# Patient Record
Sex: Male | Born: 1950 | Race: Black or African American | Hispanic: No | Marital: Married
Health system: Southern US, Community
[De-identification: ages and names within clinical notes are randomized; demographics above are authoritative.]

---

## 2014-05-13 ENCOUNTER — Inpatient Hospital Stay
Admission: AD | Admit: 2014-05-13 | Discharge: 2014-06-04 | Disposition: A | Discharge status: Skilled Nursing Facility | Payer: Self-pay | Source: Ambulatory Visit | Attending: Internal Medicine | Admitting: Internal Medicine

## 2014-05-13 DIAGNOSIS — R0602 Shortness of breath: Secondary | ICD-10-CM

## 2014-05-13 DIAGNOSIS — J942 Hemothorax: Secondary | ICD-10-CM

## 2014-05-13 DIAGNOSIS — IMO0002 Reserved for concepts with insufficient information to code with codable children: Secondary | ICD-10-CM

## 2014-05-13 DIAGNOSIS — K567 Ileus, unspecified: Secondary | ICD-10-CM

## 2014-05-13 DIAGNOSIS — K219 Gastro-esophageal reflux disease without esophagitis: Secondary | ICD-10-CM

## 2014-05-13 DIAGNOSIS — E119 Type 2 diabetes mellitus without complications: Secondary | ICD-10-CM

## 2014-05-13 DIAGNOSIS — J940 Chylous effusion: Secondary | ICD-10-CM

## 2014-05-13 DIAGNOSIS — Z902 Acquired absence of lung [part of]: Secondary | ICD-10-CM

## 2014-05-13 DIAGNOSIS — J939 Pneumothorax, unspecified: Secondary | ICD-10-CM

## 2014-05-13 DIAGNOSIS — J9620 Acute and chronic respiratory failure, unspecified whether with hypoxia or hypercapnia: Secondary | ICD-10-CM

## 2014-05-13 DIAGNOSIS — I10 Essential (primary) hypertension: Secondary | ICD-10-CM

## 2014-05-13 DIAGNOSIS — R05 Cough: Secondary | ICD-10-CM

## 2014-05-13 DIAGNOSIS — K9189 Other postprocedural complications and disorders of digestive system: Secondary | ICD-10-CM

## 2014-05-13 DIAGNOSIS — K7400 Hepatic fibrosis, unspecified: Secondary | ICD-10-CM

## 2014-05-13 DIAGNOSIS — J9382 Other air leak: Secondary | ICD-10-CM

## 2014-05-13 DIAGNOSIS — Z9889 Other specified postprocedural states: Secondary | ICD-10-CM

## 2014-05-13 DIAGNOSIS — J439 Emphysema, unspecified: Secondary | ICD-10-CM

## 2014-05-13 DIAGNOSIS — J9690 Respiratory failure, unspecified, unspecified whether with hypoxia or hypercapnia: Secondary | ICD-10-CM

## 2014-05-13 DIAGNOSIS — R627 Adult failure to thrive: Secondary | ICD-10-CM

## 2014-05-13 DIAGNOSIS — R059 Cough, unspecified: Secondary | ICD-10-CM

## 2014-05-13 DIAGNOSIS — J9383 Other pneumothorax: Secondary | ICD-10-CM

## 2014-05-14 ENCOUNTER — Institutional Professional Consult (permissible substitution) (HOSPITAL_COMMUNITY): Payer: Self-pay

## 2014-05-14 DIAGNOSIS — J9621 Acute and chronic respiratory failure with hypoxia: Secondary | ICD-10-CM

## 2014-05-14 DIAGNOSIS — K219 Gastro-esophageal reflux disease without esophagitis: Secondary | ICD-10-CM

## 2014-05-14 DIAGNOSIS — J9382 Other air leak: Secondary | ICD-10-CM

## 2014-05-14 DIAGNOSIS — J9620 Acute and chronic respiratory failure, unspecified whether with hypoxia or hypercapnia: Secondary | ICD-10-CM

## 2014-05-14 DIAGNOSIS — Z9889 Other specified postprocedural states: Secondary | ICD-10-CM

## 2014-05-14 DIAGNOSIS — J439 Emphysema, unspecified: Secondary | ICD-10-CM

## 2014-05-14 DIAGNOSIS — E119 Type 2 diabetes mellitus without complications: Secondary | ICD-10-CM

## 2014-05-14 DIAGNOSIS — R627 Adult failure to thrive: Secondary | ICD-10-CM

## 2014-05-14 DIAGNOSIS — Z902 Acquired absence of lung [part of]: Secondary | ICD-10-CM

## 2014-05-14 DIAGNOSIS — I10 Essential (primary) hypertension: Secondary | ICD-10-CM

## 2014-05-14 DIAGNOSIS — J9383 Other pneumothorax: Secondary | ICD-10-CM

## 2014-05-14 DIAGNOSIS — IMO0002 Reserved for concepts with insufficient information to code with codable children: Secondary | ICD-10-CM

## 2014-05-14 DIAGNOSIS — K9189 Other postprocedural complications and disorders of digestive system: Secondary | ICD-10-CM

## 2014-05-14 DIAGNOSIS — K567 Ileus, unspecified: Secondary | ICD-10-CM

## 2014-05-14 LAB — COMPREHENSIVE METABOLIC PANEL
ALT: 256 U/L — ABNORMAL HIGH (ref 0–53)
ANION GAP: 13 (ref 5–15)
AST: 95 U/L — ABNORMAL HIGH (ref 0–37)
Albumin: 2.6 g/dL — ABNORMAL LOW (ref 3.5–5.2)
Alkaline Phosphatase: 166 U/L — ABNORMAL HIGH (ref 39–117)
BILIRUBIN TOTAL: 2.7 mg/dL — AB (ref 0.3–1.2)
BUN: 25 mg/dL — AB (ref 6–23)
CO2: 26 mmol/L (ref 19–32)
CREATININE: 0.58 mg/dL (ref 0.50–1.35)
Calcium: 8.6 mg/dL (ref 8.4–10.5)
Chloride: 94 mmol/L — ABNORMAL LOW (ref 96–112)
GFR calc Af Amer: >90 mL/min (ref >90)
Glucose, Bld: 104 mg/dL — ABNORMAL HIGH (ref 70–99)
Potassium: 4.1 mmol/L (ref 3.5–5.1)
Sodium: 133 mmol/L — ABNORMAL LOW (ref 135–145)
Total Protein: 5.5 g/dL — ABNORMAL LOW (ref 6.0–8.3)

## 2014-05-14 LAB — SEDIMENTATION RATE: Sed Rate: 3 mm/hr (ref 0–16)

## 2014-05-14 LAB — CBC
HCT: 38.5 % — ABNORMAL LOW (ref 39.0–52.0)
HEMOGLOBIN: 13.2 g/dL (ref 13.0–17.0)
MCH: 27.8 pg (ref 26.0–34.0)
MCHC: 34.3 g/dL (ref 30.0–36.0)
MCV: 81.2 fL (ref 78.0–100.0)
Platelets: 261 10*3/uL (ref 150–400)
RBC: 4.74 MIL/uL (ref 4.22–5.81)
RDW: 21.6 % — AB (ref 11.5–15.5)
WBC: 22.1 10*3/uL — AB (ref 4.0–10.5)

## 2014-05-14 LAB — PHOSPHORUS: Phosphorus: 4.2 mg/dL (ref 2.3–4.6)

## 2014-05-14 NOTE — Consult Note (Signed)
Name: Sergio Cox MRN: [id number] DOB: 11-09-1950    ADMISSION DATE:  05/13/2014 CONSULTATION DATE:  4/15  REFERRING MD :  [facility]  CHIEF COMPLAINT:  sob  BRIEF PATIENT DESCRIPTION: Frail wasted AAM  SIGNIFICANT EVENTS  4/14 tx to ssh Right PICC>>  STUDIES:    HISTORY OF PRESENT ILLNESS:  Sergio Cox is a former smoker with severe emphysema/COPD, former smoker who has a long history of spontaneous pneumothoraces and presented with failed heimlich valve and presented to [facility] for further treatment. He has had recent extensive thoracic surgery by Dr. Donzetta Kohut [phone]) at [facility] that included A. 03/16/14 Rt thoracotomy with bleb resection. B. RLL lobectomy 03/30/14. C. FOB with bronchial valve placement. Despite these aggressive interventions he continues to have a right apical pnx, progressive FTT, poor pulmonary function. He was transferred to [facility] 4/14 with known rt pnx post chest tube removal and PCCM is consulted 4/15. There is a note from Dr.  Donzetta Kohut [facility] not to place further chest tubes unless he is called.  PAST MEDICAL HISTORY :     Acute on chronic respiratory failure   COPD with emphysema   Spontaneous pneumothorax   S/P lobectomy of lung 03/30/14 with rll lobectomy   Persistent air leak   Ileus, postoperative   FTT (failure to thrive) in adult   HTN (hypertension)   DM2 (diabetes mellitus, type 2)   GERD (gastroesophageal reflux disease)   S/P thoracotomy 2/16 /16   S/P bronchoscopy 3/25 rml valves placed   Prior to Admission medications   Not on File   Allergies  Allergen Reactions  . Opium   . Penicillins     FAMILY HISTORY:  family history is not on file. SOCIAL HISTORY:  Former smoker  REVIEW OF SYSTEMS:   10 point review of system taken, please see HPI for positives and negatives.  SUBJECTIVE:   VITAL SIGNS: Vital signs reviewed. Abnormal values will appear under impression plan section.    PHYSICAL EXAMINATION: General:  Frail, thin, increased wob, poor cough mechanics, AAF Neuro:  Intact, MAE x 4, speech is clear HEENT:  No JVD, NO LAN, Oral mucosa dry Cardiovascular: HSR RRR No murmur Lungs:  Decreased air movement, + rhonchi, congested., mild exp wheeze Abdomen:  +bs, soft Musculoskeletal:  intact Skin:  Warm and dry   Recent Labs Lab 05/14/14 0540  NA 133*  K 4.1  CL 94*  CO2 26  BUN 25*  CREATININE 0.58  GLUCOSE 104*    Recent Labs Lab 05/14/14 0540  HGB 13.2  HCT 38.5*  WBC 22.1*  PLT 261   Dg Chest Port 1 View  05/14/2014   CLINICAL DATA:  Respiratory failure. Known right pneumothorax with history of multiple prior pneumothoraces.  EXAM: PORTABLE CHEST - 1 VIEW  COMPARISON:  None.  FINDINGS: A right PICC line is present with tip projecting over the lower SVC. The cardiac silhouette is within normal limits for size. Postsurgical changes are present in the right lung with multiple staple lines as well as a a filter type device projecting over the right hilum. There is a small right apical pneumothorax. There is abnormal confluent parenchymal opacity in the medial right upper lobe, with patchy opacities in the right perihilar and right basilar regions. Bullous changes are present in the left lung. Scattered linear densities in the left lung base may represent scarring. No sizable pleural effusion is identified.  IMPRESSION: 1. Small right pneumothorax. 2. Postsurgical changes in the  right lung with abnormal opacity in the medial right upper lobe as well as patchy opacities in the perihilar and basilar regions. Findings may reflect a combination of postsurgical changes, chronic interstitial lung disease, and atelectasis, although superimposed infection or a right upper lobe mass are not excluded. Comparison with prior studies is suggested to assess the chronicity of these findings as well as radiographic follow-up during/following the patient's acute treatment.  These results were called by telephone at the time of interpretation on 05/14/2014 at 8:27 am to Malena Peerlivia Strader, RN, who verbally acknowledged these results.   Electronically Signed   By: Sebastian AcheAllen  Grady   On: 05/14/2014 08:33    ASSESSMENT     Acute on chronic respiratory failure   Chronic rt pnx   COPD with emphysema   Spontaneous pneumothorax    Thoracotomy 03/16/14 rt bleb resection.   S/P lobectomy of lung 03/30/14 with rll lobectomy   S/P bronchoscopy 3/25 rml valves placed   Persistent air leak   Ileus, postoperative   FTT (failure to thrive) in adult   HTN (hypertension)   DM2 (diabetes mellitus, type 2)   GERD (gastroesophageal reflux disease)      Discussion: Mr. Sergio Cox is a former smoker with severe emphysema/COPD, former smoker who has a long history of spontaneous pneumothoraces and presented with failed heimlich valve and [facility]. He has had recent extensive thoracic surgery by Dr. Delia ChimesEdgerton([phone]) [facility] that included A. 03/16/14 Rt thoracotomy with bleb resection. B. RLL lobectomy 03/30/14. C. FOB with bronchial valve placement. Despite these aggressive interventions he continues to have a right apical pnx, progressive FTT, poor pulmonary function. [facility] with known rt pnx post chest tube removal and PCCM is consulted 4/15. There is a note from Dr.  Donzetta KohutEdgerton [facility] not to place further chest tubes unless he is called. At this point unclear whether there is an intervention to be made to assist with the presumed intermittent recurrent air leaks  PLAN:  O2 as needed Serial chest x rays BD's as needed Could consult [facility] for a second opinion regarding any possible intervention here, but he has already been extensively evaluated and is well known to Dr Donzetta KohutEdgerton; at this point would not place ct unless he had tension PTX Doubt he could undergo FOB in his weakened state; unclear  whether one-way valve would be useful.  Chronic steroids ? If tested for alpha 1 AT deficiency    [facility] ACNP [facility] P[facility] Pager [phone] till 3 pm If no answer page [phone] 05/14/2014, 9:41 AM   Attending Note:  I have examined patient, reviewed labs, studies and notes. I have discussed the case with S Minor, and I agree with the data and plans as amended above.   Levy Pupaobert Byrum, MD, PhD 05/14/2014, 1:14 PM [facility] [phone] or if no answer [phone]

## 2014-05-15 LAB — DIGOXIN LEVEL: DIGOXIN LVL: 1.1 ng/mL (ref 0.8–2.0)

## 2014-05-17 LAB — CULTURE, RESPIRATORY W GRAM STAIN

## 2014-05-17 LAB — CULTURE, RESPIRATORY

## 2014-05-17 NOTE — Consult Note (Signed)
Name: Sergio Cox MRN: [id number] DOB: [birth year]    ADMISSION DATE:  05/13/2014 CONSULTATION DATE:  4/15  [facility]  CHIEF COMPLAINT:  sob  BRIEF PATIENT DESCRIPTION: Mr. Sergio Cox is a former smoker with severe emphysema/COPD, former smoker who has a long history of spontaneous pneumothoraces and presented with failed heimlich valve and [facility]. He has had recent extensive thoracic surgery by Dr. Donzetta KohutEdgerton [phone]) [facility] that included A. 03/16/14 Rt thoracotomy with bleb resection. B. RLL lobectomy 03/30/14. C. FOB with bronchial valve placement. Despite these aggressive interventions he continues to have a right apical pnx, progressive FTT, poor pulmonary function. [facility] with known rt pnx post chest tube removal and PCCM is consulted 4/15. There is a note from Dr.  Donzetta KohutEdgerton [facility] not to place further chest tubes unless he is called.  SIGNIFICANT EVENTS  4/14 tx to ssh Right PICC>>  STUDIES:    SUBJECTIVE:  Feeling better.  Still with weak cough. No new c/o.  On 3L [place].   VITAL SIGNS: Vital signs reviewed. Abnormal values will appear under impression plan section.   PHYSICAL EXAMINATION: General:  Frail, thin, NAD  Neuro:  Intact, MAE x 4, speech is clear HEENT:  No JVD, NO LAN, Oral mucosa dry Cardiovascular: HSR RRR No murmur Lungs:  Decreased air movement, + wet rhonchi, congested, no audible wheeze, wet cough Abdomen:  +bs, soft Musculoskeletal:  intact Skin:  Warm and dry   Recent Labs Lab 05/14/14 0540  NA 133*  K 4.1  CL 94*  CO2 26  BUN 25*  CREATININE 0.58  GLUCOSE 104*    Recent Labs Lab 05/14/14 0540  HGB 13.2  HCT 38.5*  WBC 22.1*  PLT 261   No results found.  ASSESSMENT     Acute on chronic respiratory failure   Chronic rt pnx   COPD with emphysema   Spontaneous pneumothorax    Thoracotomy 03/16/14 rt bleb resection.  S/P lobectomy of lung 03/30/14 with rll lobectomy   S/P bronchoscopy 3/25 rml valves placed   Persistent air leak   Ileus, postoperative   FTT (failure to thrive) in adult   HTN (hypertension)   DM2 (diabetes mellitus, type 2)   GERD (gastroesophageal reflux disease)  PLAN: - Supplemental O2 as needed - F/u CXR 4/19 - BD's as needed - Could consult [facility]CTS for a second opinion regarding any possible intervention here, but he has already been extensively evaluated and is well known to Dr Donzetta KohutEdgerton; at this point would not place CT unless he had tension PTX - Chronic steroids - more aggressive pulmonary hygiene - incentive, flutter, mobilize  PCCM signing off, please call back if needed.   Dirk DressKaty Whiteheart, NP 05/17/2014  11:42 AM Pager: [phone] or [phone]  I reviewed CXR myself, small right PTX and opacification of medial RUL.  Spontaneous PTX: will order repeat CXR to insure resolution, no need for CT placement at this time.  COPD with emphysema and new hypoxemia: not wheezing, continue chronic steroids, supplemental O2 for sat of 88-92%.  Persistent air leak: place tube to suction and avoid positive pressure ventilation until resolution, no further chest tube placement.  Plan as above, PCCM will sign off, please call back if needed.  Patient seen and examined, agree with above note.  I dictated the care and orders written for this patient under my direction.  Alyson ReedyWesam G Catherene Kaleta, MD  [phone]  

## 2014-05-19 ENCOUNTER — Other Ambulatory Visit (HOSPITAL_COMMUNITY): Payer: Self-pay

## 2014-05-19 LAB — BASIC METABOLIC PANEL
Anion gap: 12 (ref 5–15)
BUN: 24 mg/dL — ABNORMAL HIGH (ref 6–23)
CHLORIDE: 93 mmol/L — AB (ref 96–112)
CO2: 29 mmol/L (ref 19–32)
Calcium: 8.7 mg/dL (ref 8.4–10.5)
Creatinine, Ser: 0.61 mg/dL (ref 0.50–1.35)
GFR calc Af Amer: >90 mL/min (ref >90)
GFR calc non Af Amer: >90 mL/min (ref >90)
Glucose, Bld: 105 mg/dL — ABNORMAL HIGH (ref 70–99)
POTASSIUM: 3.9 mmol/L (ref 3.5–5.1)
SODIUM: 134 mmol/L — AB (ref 135–145)

## 2014-05-19 LAB — CBC WITH DIFFERENTIAL/PLATELET
Basophils Absolute: 0 10*3/uL (ref 0.0–0.1)
Basophils Relative: 0 % (ref 0–1)
EOS PCT: 0 % (ref 0–5)
Eosinophils Absolute: 0 10*3/uL (ref 0.0–0.7)
HCT: 36.4 % — ABNORMAL LOW (ref 39.0–52.0)
Hemoglobin: 12.2 g/dL — ABNORMAL LOW (ref 13.0–17.0)
LYMPHS ABS: 3.2 10*3/uL (ref 0.7–4.0)
Lymphocytes Relative: 13 % (ref 12–46)
MCH: 28.4 pg (ref 26.0–34.0)
MCHC: 33.5 g/dL (ref 30.0–36.0)
MCV: 84.7 fL (ref 78.0–100.0)
Monocytes Absolute: 1.5 10*3/uL — ABNORMAL HIGH (ref 0.1–1.0)
Monocytes Relative: 6 % (ref 3–12)
Neutro Abs: 19.6 10*3/uL — ABNORMAL HIGH (ref 1.7–7.7)
Neutrophils Relative %: 81 % — ABNORMAL HIGH (ref 43–77)
Platelets: 241 10*3/uL (ref 150–400)
RBC: 4.3 MIL/uL (ref 4.22–5.81)
RDW: 19.5 % — ABNORMAL HIGH (ref 11.5–15.5)
WBC: 24.3 10*3/uL — ABNORMAL HIGH (ref 4.0–10.5)

## 2014-05-20 LAB — CBC WITH DIFFERENTIAL/PLATELET
BASOS PCT: 1 % (ref 0–1)
Basophils Absolute: 0 10*3/uL (ref 0.0–0.1)
Basophils Absolute: 0.2 10*3/uL — ABNORMAL HIGH (ref 0.0–0.1)
Basophils Relative: 0 % (ref 0–1)
EOS PCT: 0 % (ref 0–5)
EOS PCT: 0 % (ref 0–5)
Eosinophils Absolute: 0 10*3/uL (ref 0.0–0.7)
Eosinophils Absolute: 0 10*3/uL (ref 0.0–0.7)
HCT: 38.7 % — ABNORMAL LOW (ref 39.0–52.0)
HCT: 37.6 % — ABNORMAL LOW (ref 39.0–52.0)
HEMOGLOBIN: 13.1 g/dL (ref 13.0–17.0)
Hemoglobin: 13.3 g/dL (ref 13.0–17.0)
LYMPHS ABS: 3.1 10*3/uL (ref 0.7–4.0)
LYMPHS PCT: 17 % (ref 12–46)
LYMPHS PCT: 13 % (ref 12–46)
Lymphs Abs: 4.8 10*3/uL — ABNORMAL HIGH (ref 0.7–4.0)
MCH: 28.6 pg (ref 26.0–34.0)
MCH: 29.3 pg (ref 26.0–34.0)
MCHC: 34.4 g/dL (ref 30.0–36.0)
MCHC: 34.8 g/dL (ref 30.0–36.0)
MCV: 83.2 fL (ref 78.0–100.0)
MCV: 84.1 fL (ref 78.0–100.0)
MONO ABS: 1.4 10*3/uL — AB (ref 0.1–1.0)
Monocytes Absolute: 0.7 10*3/uL (ref 0.1–1.0)
Monocytes Relative: 5 % (ref 3–12)
Monocytes Relative: 3 % (ref 3–12)
NEUTROS PCT: 78 % — AB (ref 43–77)
Neutro Abs: 21.9 10*3/uL — ABNORMAL HIGH (ref 1.7–7.7)
Neutro Abs: 19.8 10*3/uL — ABNORMAL HIGH (ref 1.7–7.7)
Neutrophils Relative %: 83 % — ABNORMAL HIGH (ref 43–77)
PLATELETS: 247 10*3/uL (ref 150–400)
Platelets: 252 10*3/uL (ref 150–400)
RBC: 4.65 MIL/uL (ref 4.22–5.81)
RBC: 4.47 MIL/uL (ref 4.22–5.81)
RDW: 19.2 % — ABNORMAL HIGH (ref 11.5–15.5)
RDW: 19.2 % — AB (ref 11.5–15.5)
WBC: 28.1 10*3/uL — ABNORMAL HIGH (ref 4.0–10.5)
WBC: 23.8 10*3/uL — AB (ref 4.0–10.5)

## 2014-05-20 LAB — PROCALCITONIN: Procalcitonin: 0.31 ng/mL

## 2014-05-21 LAB — PHOSPHORUS: Phosphorus: 4 mg/dL (ref 2.3–4.6)

## 2014-05-21 LAB — BASIC METABOLIC PANEL
Anion gap: 13 (ref 5–15)
BUN: 25 mg/dL — ABNORMAL HIGH (ref 6–23)
CALCIUM: 8.7 mg/dL (ref 8.4–10.5)
CHLORIDE: 92 mmol/L — AB (ref 96–112)
CO2: 28 mmol/L (ref 19–32)
Creatinine, Ser: 0.56 mg/dL (ref 0.50–1.35)
GFR calc non Af Amer: >90 mL/min (ref >90)
Glucose, Bld: 126 mg/dL — ABNORMAL HIGH (ref 70–99)
Potassium: 3.5 mmol/L (ref 3.5–5.1)
SODIUM: 133 mmol/L — AB (ref 135–145)

## 2014-05-21 LAB — MAGNESIUM: Magnesium: 2 mg/dL (ref 1.5–2.5)

## 2014-05-21 LAB — URINALYSIS, ROUTINE W REFLEX MICROSCOPIC
GLUCOSE, UA: 250 mg/dL — AB
Hgb urine dipstick: NEGATIVE
KETONES UR: NEGATIVE mg/dL
Leukocytes, UA: NEGATIVE
Nitrite: NEGATIVE
Protein, ur: 30 mg/dL — AB
Specific Gravity, Urine: 1.023 (ref 1.005–1.030)
Urobilinogen, UA: 1 mg/dL (ref 0.0–1.0)
pH: 5.5 (ref 5.0–8.0)

## 2014-05-21 LAB — URINE MICROSCOPIC-ADD ON

## 2014-05-21 LAB — CLOSTRIDIUM DIFFICILE BY PCR: Toxigenic C. Difficile by PCR: NEGATIVE

## 2014-05-22 LAB — CBC WITH DIFFERENTIAL/PLATELET
Basophils Absolute: 0 10*3/uL (ref 0.0–0.1)
Basophils Relative: 0 % (ref 0–1)
EOS PCT: 0 % (ref 0–5)
Eosinophils Absolute: 0 10*3/uL (ref 0.0–0.7)
HCT: 37.6 % — ABNORMAL LOW (ref 39.0–52.0)
HEMOGLOBIN: 12.7 g/dL — AB (ref 13.0–17.0)
LYMPHS PCT: 13 % (ref 12–46)
Lymphs Abs: 3.3 10*3/uL (ref 0.7–4.0)
MCH: 28.3 pg (ref 26.0–34.0)
MCHC: 33.8 g/dL (ref 30.0–36.0)
MCV: 83.9 fL (ref 78.0–100.0)
MONOS PCT: 5 % (ref 3–12)
Monocytes Absolute: 1.3 10*3/uL — ABNORMAL HIGH (ref 0.1–1.0)
NEUTROS ABS: 20.6 10*3/uL — AB (ref 1.7–7.7)
Neutrophils Relative %: 82 % — ABNORMAL HIGH (ref 43–77)
Platelets: 220 10*3/uL (ref 150–400)
RBC: 4.48 MIL/uL (ref 4.22–5.81)
RDW: 19.2 % — ABNORMAL HIGH (ref 11.5–15.5)
WBC: 25.2 10*3/uL — ABNORMAL HIGH (ref 4.0–10.5)

## 2014-05-22 LAB — BASIC METABOLIC PANEL
Anion gap: 10 (ref 5–15)
BUN: 22 mg/dL (ref 6–23)
CHLORIDE: 95 mmol/L — AB (ref 96–112)
CO2: 29 mmol/L (ref 19–32)
CREATININE: 0.57 mg/dL (ref 0.50–1.35)
Calcium: 8.8 mg/dL (ref 8.4–10.5)
GFR calc non Af Amer: >90 mL/min (ref >90)
Glucose, Bld: 134 mg/dL — ABNORMAL HIGH (ref 70–99)
POTASSIUM: 4.1 mmol/L (ref 3.5–5.1)
SODIUM: 134 mmol/L — AB (ref 135–145)

## 2014-05-24 ENCOUNTER — Other Ambulatory Visit (HOSPITAL_COMMUNITY): Payer: Self-pay

## 2014-05-24 LAB — CBC WITH DIFFERENTIAL/PLATELET
Basophils Absolute: 0 10*3/uL (ref 0.0–0.1)
Basophils Relative: 0 % (ref 0–1)
EOS ABS: 0 10*3/uL (ref 0.0–0.7)
Eosinophils Relative: 0 % (ref 0–5)
HCT: 40.6 % (ref 39.0–52.0)
Hemoglobin: 13.7 g/dL (ref 13.0–17.0)
Lymphocytes Relative: 14 % (ref 12–46)
Lymphs Abs: 3.8 10*3/uL (ref 0.7–4.0)
MCH: 28.5 pg (ref 26.0–34.0)
MCHC: 33.7 g/dL (ref 30.0–36.0)
MCV: 84.4 fL (ref 78.0–100.0)
MONO ABS: 1.3 10*3/uL — AB (ref 0.1–1.0)
Monocytes Relative: 5 % (ref 3–12)
Neutro Abs: 21.8 10*3/uL — ABNORMAL HIGH (ref 1.7–7.7)
Neutrophils Relative %: 81 % — ABNORMAL HIGH (ref 43–77)
Platelets: 223 10*3/uL (ref 150–400)
RBC: 4.81 MIL/uL (ref 4.22–5.81)
RDW: 19.4 % — ABNORMAL HIGH (ref 11.5–15.5)
WBC: 26.9 10*3/uL — AB (ref 4.0–10.5)

## 2014-05-24 LAB — PHOSPHORUS: PHOSPHORUS: 4.6 mg/dL (ref 2.3–4.6)

## 2014-05-24 LAB — BASIC METABOLIC PANEL
Anion gap: 14 (ref 5–15)
BUN: 24 mg/dL — ABNORMAL HIGH (ref 6–23)
CHLORIDE: 93 mmol/L — AB (ref 96–112)
CO2: 29 mmol/L (ref 19–32)
Calcium: 9.4 mg/dL (ref 8.4–10.5)
Creatinine, Ser: 0.61 mg/dL (ref 0.50–1.35)
GFR calc Af Amer: >90 mL/min (ref >90)
GFR calc non Af Amer: >90 mL/min (ref >90)
Glucose, Bld: 113 mg/dL — ABNORMAL HIGH (ref 70–99)
Potassium: 4.1 mmol/L (ref 3.5–5.1)
Sodium: 136 mmol/L (ref 135–145)

## 2014-05-24 LAB — MAGNESIUM: Magnesium: 1.9 mg/dL (ref 1.5–2.5)

## 2014-05-24 LAB — HEMOGLOBIN A1C
Hgb A1c MFr Bld: 5.6 % (ref 4.8–5.6)
Mean Plasma Glucose: 114 mg/dL

## 2014-05-25 ENCOUNTER — Other Ambulatory Visit (HOSPITAL_COMMUNITY): Payer: Self-pay

## 2014-05-26 ENCOUNTER — Institutional Professional Consult (permissible substitution) (HOSPITAL_COMMUNITY): Payer: Self-pay

## 2014-05-26 ENCOUNTER — Other Ambulatory Visit (HOSPITAL_COMMUNITY): Payer: Self-pay

## 2014-05-26 LAB — CBC WITH DIFFERENTIAL/PLATELET
BASOS ABS: 0 10*3/uL (ref 0.0–0.1)
Basophils Relative: 0 % (ref 0–1)
Eosinophils Absolute: 0 10*3/uL (ref 0.0–0.7)
Eosinophils Relative: 0 % (ref 0–5)
HEMATOCRIT: 43.1 % (ref 39.0–52.0)
Hemoglobin: 14.7 g/dL (ref 13.0–17.0)
LYMPHS PCT: 15 % (ref 12–46)
Lymphs Abs: 3.9 10*3/uL (ref 0.7–4.0)
MCH: 28.4 pg (ref 26.0–34.0)
MCHC: 34.1 g/dL (ref 30.0–36.0)
MCV: 83.4 fL (ref 78.0–100.0)
MONOS PCT: 4 % (ref 3–12)
Monocytes Absolute: 1 10*3/uL (ref 0.1–1.0)
Neutro Abs: 21.2 10*3/uL — ABNORMAL HIGH (ref 1.7–7.7)
Neutrophils Relative %: 81 % — ABNORMAL HIGH (ref 43–77)
PLATELETS: 203 10*3/uL (ref 150–400)
RBC: 5.17 MIL/uL (ref 4.22–5.81)
RDW: 19 % — AB (ref 11.5–15.5)
WBC: 26.1 10*3/uL — ABNORMAL HIGH (ref 4.0–10.5)

## 2014-05-26 LAB — COMPREHENSIVE METABOLIC PANEL
ALT: 480 U/L — AB (ref 0–53)
AST: 90 U/L — AB (ref 0–37)
Albumin: 3 g/dL — ABNORMAL LOW (ref 3.5–5.2)
Alkaline Phosphatase: 232 U/L — ABNORMAL HIGH (ref 39–117)
Anion gap: 14 (ref 5–15)
BUN: 33 mg/dL — ABNORMAL HIGH (ref 6–23)
CO2: 26 mmol/L (ref 19–32)
Calcium: 9.3 mg/dL (ref 8.4–10.5)
Chloride: 93 mmol/L — ABNORMAL LOW (ref 96–112)
Creatinine, Ser: 0.69 mg/dL (ref 0.50–1.35)
GFR calc non Af Amer: >90 mL/min (ref >90)
GLUCOSE: 145 mg/dL — AB (ref 70–99)
POTASSIUM: 3.2 mmol/L — AB (ref 3.5–5.1)
SODIUM: 133 mmol/L — AB (ref 135–145)
TOTAL PROTEIN: 6.5 g/dL (ref 6.0–8.3)
Total Bilirubin: 1.5 mg/dL — ABNORMAL HIGH (ref 0.3–1.2)

## 2014-05-26 LAB — MAGNESIUM: MAGNESIUM: 1.8 mg/dL (ref 1.5–2.5)

## 2014-05-26 LAB — PHOSPHORUS: Phosphorus: 4.7 mg/dL — ABNORMAL HIGH (ref 2.3–4.6)

## 2014-05-27 LAB — BASIC METABOLIC PANEL
Anion gap: 12 (ref 5–15)
BUN: 29 mg/dL — AB (ref 6–23)
CHLORIDE: 95 mmol/L — AB (ref 96–112)
CO2: 29 mmol/L (ref 19–32)
CREATININE: 0.72 mg/dL (ref 0.50–1.35)
Calcium: 9.5 mg/dL (ref 8.4–10.5)
GFR calc Af Amer: >90 mL/min (ref >90)
Glucose, Bld: 151 mg/dL — ABNORMAL HIGH (ref 70–99)
POTASSIUM: 3.8 mmol/L (ref 3.5–5.1)
Sodium: 136 mmol/L (ref 135–145)

## 2014-05-27 LAB — HEPATITIS B SURFACE ANTIGEN: Hepatitis B Surface Ag: NEGATIVE

## 2014-05-27 LAB — CBC WITH DIFFERENTIAL/PLATELET
Basophils Absolute: 0 10*3/uL (ref 0.0–0.1)
Basophils Relative: 0 % (ref 0–1)
EOS ABS: 0.1 10*3/uL (ref 0.0–0.7)
EOS PCT: 0 % (ref 0–5)
HCT: 42.7 % (ref 39.0–52.0)
HEMOGLOBIN: 14.3 g/dL (ref 13.0–17.0)
LYMPHS ABS: 3.3 10*3/uL (ref 0.7–4.0)
LYMPHS PCT: 15 % (ref 12–46)
MCH: 28.4 pg (ref 26.0–34.0)
MCHC: 33.5 g/dL (ref 30.0–36.0)
MCV: 84.7 fL (ref 78.0–100.0)
Monocytes Absolute: 1 10*3/uL (ref 0.1–1.0)
Monocytes Relative: 5 % (ref 3–12)
Neutro Abs: 17.1 10*3/uL — ABNORMAL HIGH (ref 1.7–7.7)
Neutrophils Relative %: 80 % — ABNORMAL HIGH (ref 43–77)
Platelets: 192 10*3/uL (ref 150–400)
RBC: 5.04 MIL/uL (ref 4.22–5.81)
RDW: 19 % — ABNORMAL HIGH (ref 11.5–15.5)
WBC: 21.5 10*3/uL — ABNORMAL HIGH (ref 4.0–10.5)

## 2014-05-27 LAB — HEPATITIS B CORE ANTIBODY, IGM: Hep B C IgM: NONREACTIVE

## 2014-05-27 LAB — MAGNESIUM: Magnesium: 2.1 mg/dL (ref 1.5–2.5)

## 2014-05-27 LAB — PHOSPHORUS: Phosphorus: 4.2 mg/dL (ref 2.3–4.6)

## 2014-05-28 LAB — HEPATIC FUNCTION PANEL
ALK PHOS: 190 U/L — AB (ref 39–117)
ALT: 308 U/L — ABNORMAL HIGH (ref 0–53)
AST: 79 U/L — ABNORMAL HIGH (ref 0–37)
Albumin: 2.8 g/dL — ABNORMAL LOW (ref 3.5–5.2)
BILIRUBIN DIRECT: 0.5 mg/dL (ref 0.0–0.5)
BILIRUBIN INDIRECT: 0.8 mg/dL (ref 0.3–0.9)
BILIRUBIN TOTAL: 1.3 mg/dL — AB (ref 0.3–1.2)
Total Protein: 6.4 g/dL (ref 6.0–8.3)

## 2014-05-28 LAB — RENAL FUNCTION PANEL
ALBUMIN: 2.7 g/dL — AB (ref 3.5–5.2)
ANION GAP: 12 (ref 5–15)
BUN: 31 mg/dL — ABNORMAL HIGH (ref 6–23)
CO2: 28 mmol/L (ref 19–32)
CREATININE: 0.65 mg/dL (ref 0.50–1.35)
Calcium: 9.1 mg/dL (ref 8.4–10.5)
Chloride: 96 mmol/L (ref 96–112)
GFR calc non Af Amer: >90 mL/min (ref >90)
Glucose, Bld: 104 mg/dL — ABNORMAL HIGH (ref 70–99)
Phosphorus: 4.1 mg/dL (ref 2.3–4.6)
Potassium: 4 mmol/L (ref 3.5–5.1)
Sodium: 136 mmol/L (ref 135–145)

## 2014-05-28 LAB — T4, FREE: Free T4: 1.91 ng/dL — ABNORMAL HIGH (ref 0.80–1.80)

## 2014-05-28 LAB — TSH: TSH: 1.315 u[IU]/mL (ref 0.350–4.500)

## 2014-05-28 LAB — GLUCOSE TOLERANCE, 1 HOUR: GLUCOSE 1 HOUR GTT: 102 mg/dL (ref 70–140)

## 2014-05-28 LAB — HEPATITIS B E ANTIBODY: Hep B E Ab: NEGATIVE

## 2014-05-31 LAB — COMPREHENSIVE METABOLIC PANEL
ALK PHOS: 177 U/L — AB (ref 38–126)
ALT: 241 U/L — ABNORMAL HIGH (ref 17–63)
ANION GAP: 13 (ref 5–15)
AST: 80 U/L — ABNORMAL HIGH (ref 15–41)
Albumin: 2.8 g/dL — ABNORMAL LOW (ref 3.5–5.0)
BILIRUBIN TOTAL: 1.2 mg/dL (ref 0.3–1.2)
BUN: 22 mg/dL — ABNORMAL HIGH (ref 6–20)
CALCIUM: 9 mg/dL (ref 8.9–10.3)
CHLORIDE: 88 mmol/L — AB (ref 101–111)
CO2: 30 mmol/L (ref 22–32)
Creatinine, Ser: 0.66 mg/dL (ref 0.61–1.24)
GFR calc non Af Amer: >60 mL/min (ref >60)
Glucose, Bld: 147 mg/dL — ABNORMAL HIGH (ref 70–99)
POTASSIUM: 3.5 mmol/L (ref 3.5–5.1)
Sodium: 131 mmol/L — ABNORMAL LOW (ref 135–145)
TOTAL PROTEIN: 6.2 g/dL — AB (ref 6.5–8.1)

## 2014-05-31 LAB — CBC WITH DIFFERENTIAL/PLATELET
BASOS ABS: 0 10*3/uL (ref 0.0–0.1)
Basophils Relative: 0 % (ref 0–1)
EOS PCT: 1 % (ref 0–5)
Eosinophils Absolute: 0.2 10*3/uL (ref 0.0–0.7)
HCT: 39.2 % (ref 39.0–52.0)
Hemoglobin: 13.4 g/dL (ref 13.0–17.0)
Lymphocytes Relative: 21 % (ref 12–46)
Lymphs Abs: 3.6 10*3/uL (ref 0.7–4.0)
MCH: 28.6 pg (ref 26.0–34.0)
MCHC: 34.2 g/dL (ref 30.0–36.0)
MCV: 83.6 fL (ref 78.0–100.0)
MONO ABS: 1 10*3/uL (ref 0.1–1.0)
Monocytes Relative: 6 % (ref 3–12)
Neutro Abs: 12.1 10*3/uL — ABNORMAL HIGH (ref 1.7–7.7)
Neutrophils Relative %: 72 % (ref 43–77)
Platelets: 216 10*3/uL (ref 150–400)
RBC: 4.69 MIL/uL (ref 4.22–5.81)
RDW: 18.5 % — ABNORMAL HIGH (ref 11.5–15.5)
WBC: 16.9 10*3/uL — AB (ref 4.0–10.5)

## 2014-05-31 LAB — PROCALCITONIN: Procalcitonin: 0.18 ng/mL

## 2014-06-02 ENCOUNTER — Institutional Professional Consult (permissible substitution) (HOSPITAL_COMMUNITY): Payer: Self-pay

## 2014-06-02 LAB — CULTURE, BLOOD (ROUTINE X 2)
CULTURE: NO GROWTH
Culture: NO GROWTH

## 2014-06-03 ENCOUNTER — Other Ambulatory Visit (HOSPITAL_COMMUNITY): Payer: Self-pay

## 2014-06-03 LAB — CBC WITH DIFFERENTIAL/PLATELET
BASOS ABS: 0 10*3/uL (ref 0.0–0.1)
Basophils Relative: 0 % (ref 0–1)
EOS PCT: 1 % (ref 0–5)
Eosinophils Absolute: 0.1 10*3/uL (ref 0.0–0.7)
HCT: 37.7 % — ABNORMAL LOW (ref 39.0–52.0)
HEMOGLOBIN: 13.1 g/dL (ref 13.0–17.0)
LYMPHS ABS: 3.1 10*3/uL (ref 0.7–4.0)
Lymphocytes Relative: 20 % (ref 12–46)
MCH: 28.5 pg (ref 26.0–34.0)
MCHC: 34.7 g/dL (ref 30.0–36.0)
MCV: 82.1 fL (ref 78.0–100.0)
Monocytes Absolute: 0.9 10*3/uL (ref 0.1–1.0)
Monocytes Relative: 6 % (ref 3–12)
NEUTROS ABS: 11.4 10*3/uL — AB (ref 1.7–7.7)
NEUTROS PCT: 73 % (ref 43–77)
Platelets: 275 10*3/uL (ref 150–400)
RBC: 4.59 MIL/uL (ref 4.22–5.81)
RDW: 18.3 % — AB (ref 11.5–15.5)
WBC: 15.5 10*3/uL — ABNORMAL HIGH (ref 4.0–10.5)

## 2014-06-03 LAB — BASIC METABOLIC PANEL
ANION GAP: 12 (ref 5–15)
BUN: 20 mg/dL (ref 6–20)
CALCIUM: 9 mg/dL (ref 8.9–10.3)
CHLORIDE: 90 mmol/L — AB (ref 101–111)
CO2: 30 mmol/L (ref 22–32)
Creatinine, Ser: 0.66 mg/dL (ref 0.61–1.24)
GFR calc Af Amer: >60 mL/min (ref >60)
GFR calc non Af Amer: >60 mL/min (ref >60)
Glucose, Bld: 102 mg/dL — ABNORMAL HIGH (ref 70–99)
Potassium: 3.4 mmol/L — ABNORMAL LOW (ref 3.5–5.1)
Sodium: 132 mmol/L — ABNORMAL LOW (ref 135–145)

## 2014-06-03 LAB — DIGOXIN LEVEL: DIGOXIN LVL: 1.8 ng/mL (ref 0.8–2.0)

## 2014-06-04 LAB — MAGNESIUM: Magnesium: 2.1 mg/dL (ref 1.7–2.4)

## 2014-06-04 LAB — BASIC METABOLIC PANEL
ANION GAP: 10 (ref 5–15)
BUN: 21 mg/dL — ABNORMAL HIGH (ref 6–20)
CO2: 29 mmol/L (ref 22–32)
CREATININE: 0.6 mg/dL — AB (ref 0.61–1.24)
Calcium: 9 mg/dL (ref 8.9–10.3)
Chloride: 95 mmol/L — ABNORMAL LOW (ref 101–111)
Glucose, Bld: 100 mg/dL — ABNORMAL HIGH (ref 70–99)
Potassium: 3.6 mmol/L (ref 3.5–5.1)
Sodium: 134 mmol/L — ABNORMAL LOW (ref 135–145)

## 2014-06-04 LAB — PHOSPHORUS: Phosphorus: 3.9 mg/dL (ref 2.5–4.6)

## 2014-06-04 LAB — CBC WITH DIFFERENTIAL/PLATELET
Basophils Absolute: 0 10*3/uL (ref 0.0–0.1)
Basophils Relative: 0 % (ref 0–1)
Eosinophils Absolute: 0.1 10*3/uL (ref 0.0–0.7)
Eosinophils Relative: 1 % (ref 0–5)
HEMATOCRIT: 36.2 % — AB (ref 39.0–52.0)
Hemoglobin: 12.3 g/dL — ABNORMAL LOW (ref 13.0–17.0)
LYMPHS PCT: 17 % (ref 12–46)
Lymphs Abs: 2.6 10*3/uL (ref 0.7–4.0)
MCH: 28.3 pg (ref 26.0–34.0)
MCHC: 34 g/dL (ref 30.0–36.0)
MCV: 83.2 fL (ref 78.0–100.0)
MONO ABS: 0.7 10*3/uL (ref 0.1–1.0)
Monocytes Relative: 5 % (ref 3–12)
NEUTROS ABS: 11.6 10*3/uL — AB (ref 1.7–7.7)
NEUTROS PCT: 77 % (ref 43–77)
Platelets: 270 10*3/uL (ref 150–400)
RBC: 4.35 MIL/uL (ref 4.22–5.81)
RDW: 18.3 % — AB (ref 11.5–15.5)
WBC: 15 10*3/uL — AB (ref 4.0–10.5)

## 2016-04-02 IMAGING — CR DG CHEST 1V PORT
1 series · 1 of 1 positions shown · non-contrast
Comparison: 05/19/2014.  05/14/2014.

CLINICAL DATA: Pneumothorax .

EXAM:
PORTABLE CHEST - 1 VIEW

[AP]
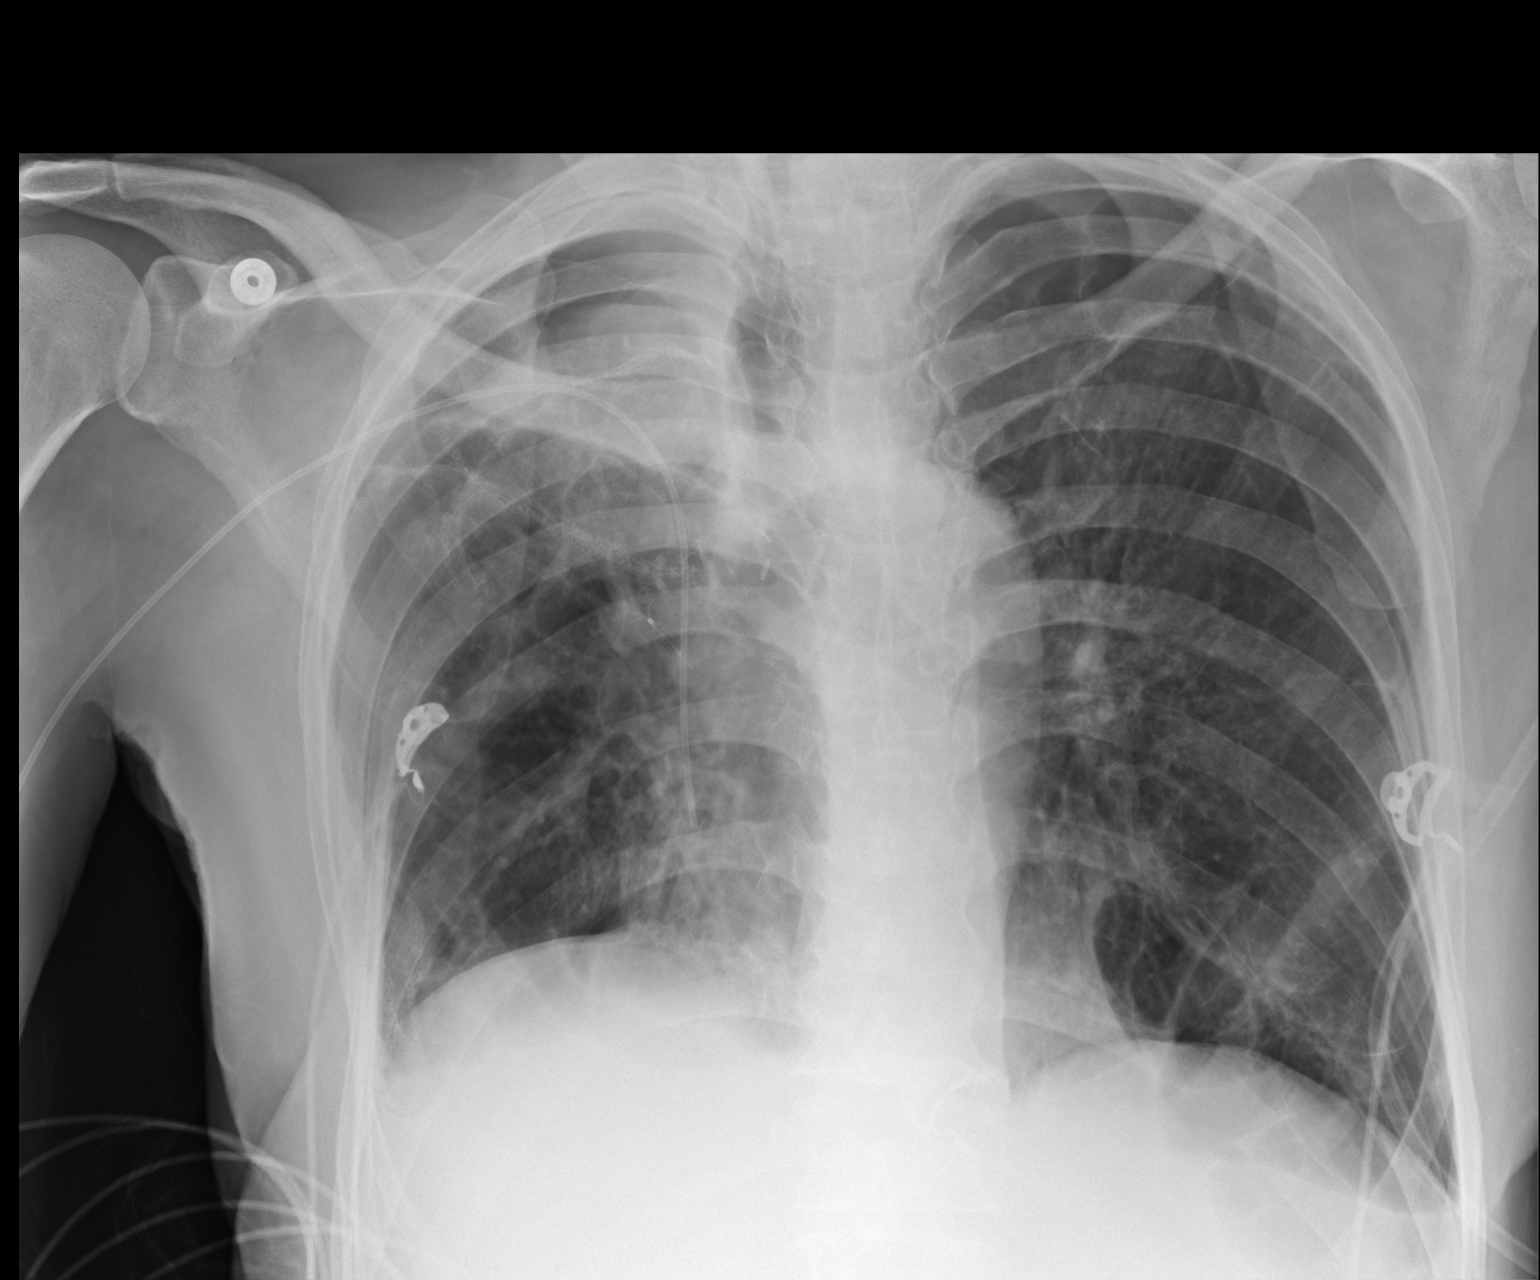

[1 of 1 positions shown; findings below may reference images not displayed]

FINDINGS: Right PICC line stable position. Mediastinum hilar structures are
stable. Postsurgical changes right upper lung with stable opacity in
the right pulmonary apex. Bibasilar subsegmental atelectasis. Stable
right pneumothorax. Heart size stable. No acute bony abnormality.
IMPRESSION: 1. PICC line in stable position.
2. Stable small right pneumothorax.
3. Stable postsurgical changes right lung with stable opacity in the
right apex. Stable bibasilar subsegmental atelectasis .

## 2016-04-03 IMAGING — CR DG CHEST 1V PORT
1 series · 1 of 1 positions shown · non-contrast
Comparison: 05/24/2014

CLINICAL DATA: Shortness of breath, cough, wheezing. Persistent
pneumothorax.

EXAM:
PORTABLE CHEST - 1 VIEW

[AP]
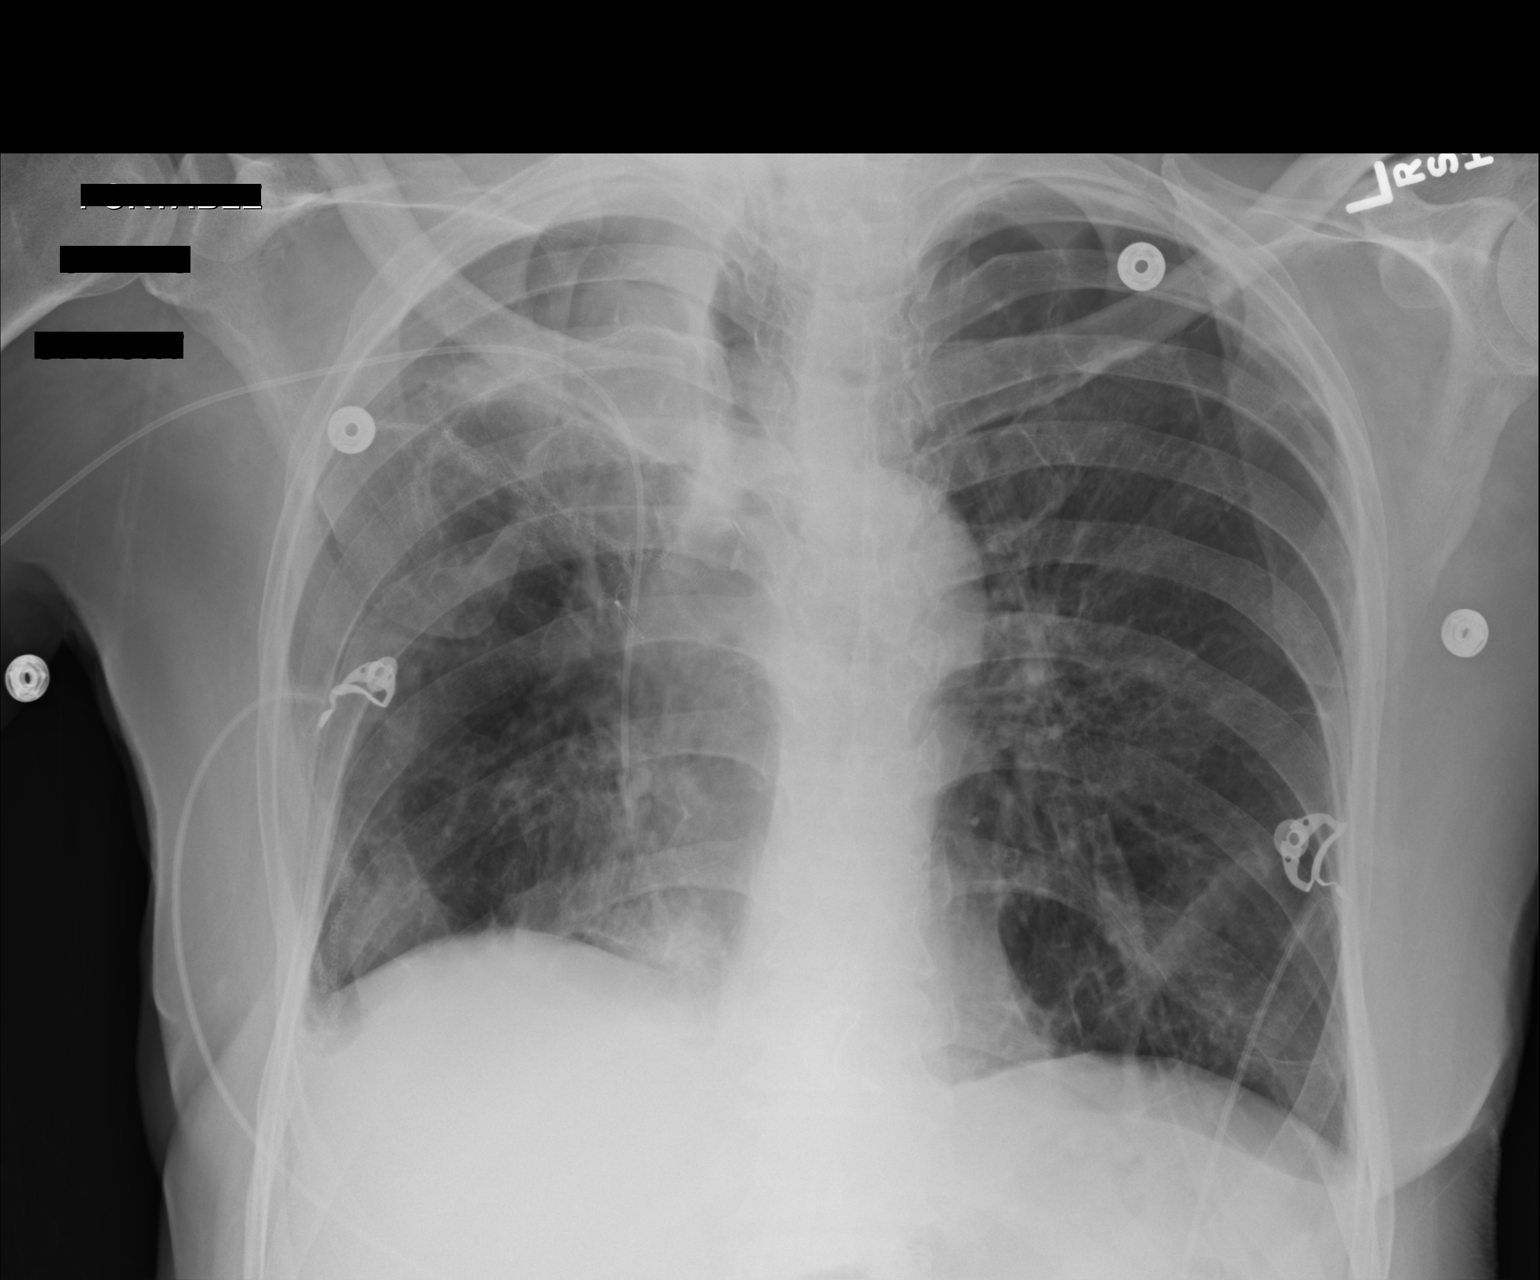

[1 of 1 positions shown; findings below may reference images not displayed]

FINDINGS: Right PICC line remains in stable position. Postoperative changes in
the right lung with persistent small right apical pneumothorax.
Stable right apical opacity. Bullous emphysematous changes noted in
the left apex. Heart is normal size. No effusions. No acute bony
abnormality.
IMPRESSION: Postoperative changes on the right with Stable right apical density
and adjacent small right apical pneumothorax.

Bullous emphysematous changes in the left apex.

## 2016-04-11 IMAGING — CR DG CHEST 1V PORT
1 series · 1 of 1 positions shown · non-contrast
Comparison: 05/26/2014

CLINICAL DATA: Cough. COPD. Prior right lower lobectomy. History of
recurrent pneumothorax.

EXAM:
PORTABLE CHEST - 1 VIEW

[ap portable]
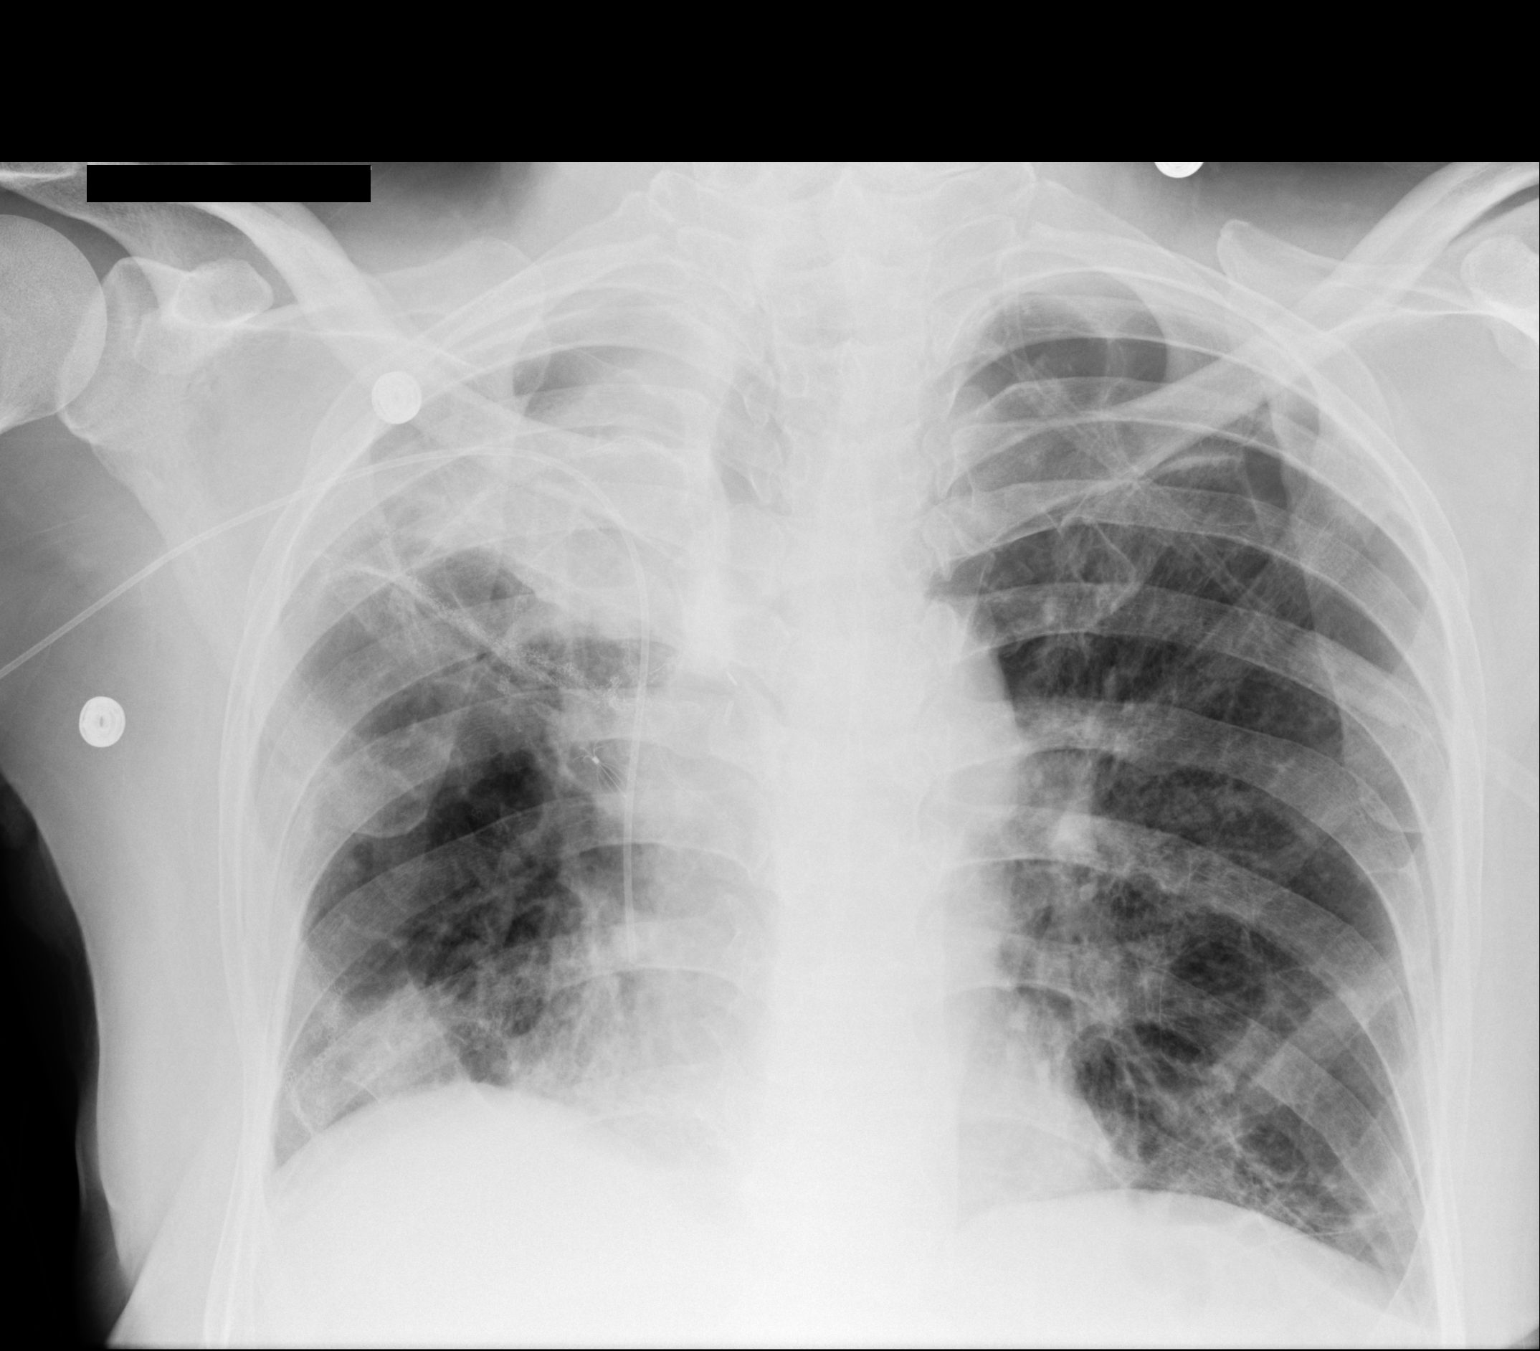

[1 of 1 positions shown; findings below may reference images not displayed]

FINDINGS: Right-sided PICC remains in place with tip near the cavoatrial
junction, unchanged. Cardiomediastinal silhouette is unchanged.
Postsurgical changes are again seen in the right lung with multiple
staple lines noted and a bronchial valve in the right hilum. Small
right apical pneumothorax is unchanged, as is adjacent opacity in
the right lung apex. Bullous emphysematous changes are again noted
in the left lung apex. Scarring or subsegmental atelectasis is
present in the left lung base. No definite pleural effusion is
identified. No acute osseous abnormality is seen.
IMPRESSION: Unchanged appearance of the chest with postoperative changes in the
right lung, persistent small right apical pneumothorax, and right
apical lung opacity.

## 2016-04-12 IMAGING — CR DG CHEST 1V PORT
1 series · 1 of 1 positions shown · non-contrast
Comparison: 06/02/2014 and earlier.

CLINICAL DATA: 63-year-old male with shortness of breath and
productive cough. Current history of COPD. Initial encounter.

EXAM:
PORTABLE CHEST - 1 VIEW

[AP]
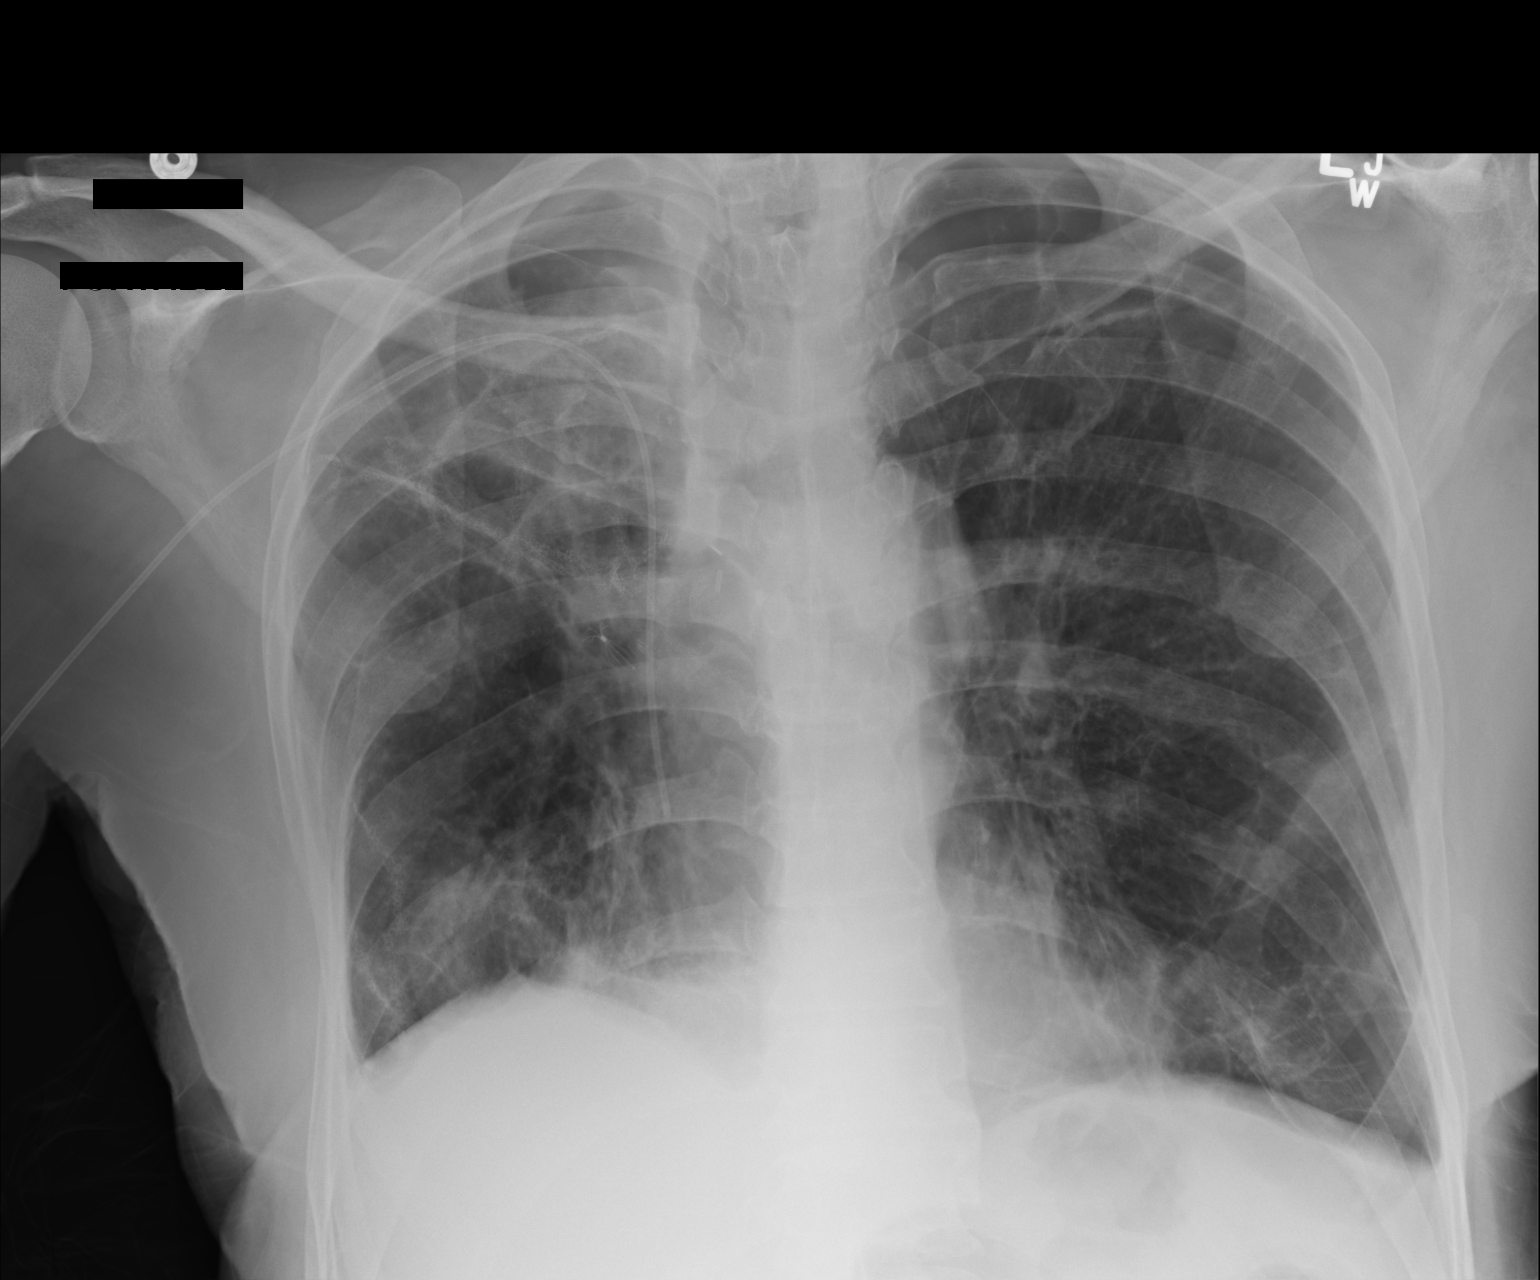

[1 of 1 positions shown; findings below may reference images not displayed]

FINDINGS: Portable AP upright view at 9098 hrs. Stable right PICC line.
Postoperative changes to the right lung including endobronchial
occluder device.

Interval resolved right apical confluent pulmonary opacity. Stable
underlying lung volumes. Residual right apical pneumothorax is
stable since 05/24/2014. Stable cardiac size and mediastinal
contours. Mildly regressed patchy opacity at both lung bases.
Bullous emphysema with some architectural distortion at the left
apex is stable. No areas of worsening ventilation.
IMPRESSION: 1. Interval improved right upper lung and bibasilar ventilation.
Stable right apical pneumothorax since 05/24/2014.
[DATE]. Postoperative changes to the right lung and bronchial tree.
3. No new cardiopulmonary abnormality.

## 2022-09-30 DEATH — deceased
